# Patient Record
Sex: Male | Born: 1995 | Race: Black or African American | Hispanic: No | Marital: Single | Smoking: Never smoker
Health system: Southern US, Community
[De-identification: ages and names within clinical notes are randomized; demographics above are authoritative.]

---
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
## 2020-04-14 ENCOUNTER — Encounter: Payer: Self-pay | Admitting: Pediatrics

## 2022-03-21 ENCOUNTER — Ambulatory Visit: Payer: Self-pay | Admitting: Dermatology
